# Patient Record
Sex: Female | Born: 2005 | Race: White | Hispanic: No | Marital: Single | Smoking: Never smoker
Health system: Southern US, Community
[De-identification: ages and names within clinical notes are randomized; demographics above are authoritative.]

## PROBLEM LIST (undated history)

## (undated) DIAGNOSIS — Z789 Other specified health status: Secondary | ICD-10-CM

## (undated) HISTORY — PX: WISDOM TOOTH EXTRACTION: SHX21

---
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
## 2023-01-06 ENCOUNTER — Encounter: Payer: Self-pay | Admitting: *Deleted

## 2023-01-25 ENCOUNTER — Telehealth: Payer: Self-pay

## 2023-01-25 NOTE — Telephone Encounter (Signed)
No working #. AS, CMA 

## 2023-02-08 DIAGNOSIS — L700 Acne vulgaris: Secondary | ICD-10-CM | POA: Diagnosis not present

## 2023-03-07 NOTE — Progress Notes (Unsigned)
    Nina Morales D.[facility] [address] [place] [address] Phone: [phone]   Assessment and Plan:     There are no diagnoses linked to this encounter.  ***   Pertinent previous records reviewed include ***   Follow Up: ***     Subjective:   I, Nina Morales, am serving as a Neurosurgeon for Doctor Richardean Sale  Chief Complaint: right foot pain   HPI:   03/08/2023 Patient is a 17 year old female complaining of right foot pain. Patient states  Relevant Historical Information: ***  Additional pertinent review of systems negative.   Current Outpatient Medications:    moxifloxacin (VIGAMOX) 0.5 % ophthalmic solution, Place 1 drop into both eyes 3 (three) times daily., Disp: 3 mL, Rfl: 0   Objective:     There were no vitals filed for this visit.    There is no height or weight on file to calculate BMI.    Physical Exam:    ***   Electronically signed by:  Nina Morales D.[facility] 10:26 AM 03/07/23

## 2023-03-08 ENCOUNTER — Ambulatory Visit (INDEPENDENT_AMBULATORY_CARE_PROVIDER_SITE_OTHER): Payer: Medicaid Other

## 2023-03-08 ENCOUNTER — Ambulatory Visit: Payer: Medicaid Other | Admitting: Sports Medicine

## 2023-03-08 VITALS — BP 120/84 | HR 58 | Ht 67.0 in | Wt 156.0 lb

## 2023-03-08 DIAGNOSIS — M79671 Pain in right foot: Secondary | ICD-10-CM | POA: Diagnosis not present

## 2023-03-08 MED ORDER — MELOXICAM 15 MG PO TABS: 15.0000 mg | ORAL_TABLET | Freq: Every day | ORAL | 0 refills | Status: DC

## 2023-03-08 NOTE — Patient Instructions (Signed)
-   Start meloxicam 15 mg daily x2 weeks.  If still having pain after 2 weeks, complete 3rd-week of meloxicam. May use remaining meloxicam as needed once daily for pain control.  Do not to use additional NSAIDs while taking meloxicam.  May use Tylenol [phone] mg 2 to 3 times a day for breakthrough pain. Recommend resting from running for 1 week then gradual re introduction Recommend getting a new pair of inserts can try fleet feet 3-4 week follow up

## 2023-03-14 NOTE — Progress Notes (Signed)
    Nina Morales D.[facility] [address] [place] [address] Phone: [phone]   Assessment and Plan:     1. Right foot pain -Acute, unchanged, subsequent visit - Continued right foot pain primarily along distal shaft and head of fifth metatarsal on plantar surface.  Still suspect bony contusion versus stress reaction from patient running - Patient continues to have pain despite no running for 1 week, and starting meloxicam, however patient did go to the beach for a week and walking along sand may have caused stress to this area - Continue and complete remaining course of meloxicam - Recommend no running for 2 weeks.  May swim to maintain cardiovascular endurance - Reassuring that patient had unremarkable x-ray at previous office visit, and unremarkable ultrasound at today's visit  Sports Medicine: Musculoskeletal Ultrasound. Exam: Limited US of fifth metatarsal, right Diagnosis: Right foot pain  US Findings: Unremarkable appearance of right fifth metatarsal.  Dorsum, lateral, plantar surface of fifth metatarsal evaluated without disturbance of cortex, without cortical thickening.  Mild hypoechoic changes along plantar surface of fifth metatarsal head which may indicate a contusion versus stress reaction  US Impression:  Fifth metatarsal stress reaction versus contusion    Pertinent previous records reviewed include none   Follow Up: 2 weeks for reevaluation.  If patient is not improving at that time, would further evaluate with MRI.   Subjective:   I, Nina Morales, am serving as a Neurosurgeon for Doctor Richardean Sale   Chief Complaint: right foot pain    HPI:    03/08/2023 Patient is a 17 year old female complaining of right foot pain. Patient states 2 weeks ago she had lateral foot pain on the bottom , no radiating pain, she had to change her running shoes, new running shoes had pain , she had pain when walking, standing, and at rest  , was running 25-30 a week. No meds for the pain. Has been icing but it helps with the pain and swelling   03/21/2023 Patient states that she is the same, meloxicam helps a little    Relevant Historical Information: None pertinent  Additional pertinent review of systems negative.   Current Outpatient Medications:    meloxicam (MOBIC) 15 MG tablet, Take 1 tablet (15 mg total) by mouth daily., Disp: 30 tablet, Rfl: 0   moxifloxacin (VIGAMOX) 0.5 % ophthalmic solution, Place 1 drop into both eyes 3 (three) times daily., Disp: 3 mL, Rfl: 0   Objective:     Vitals:   03/21/23 1355  BP: 118/78  Pulse: 63  SpO2: (!) 10%  Weight: 156 lb (70.8 kg)  Height: 5\' 7"  (1.702 m)      Body mass index is 24.43 kg/m.    Physical Exam:    Gen: Appears well, nad, nontoxic and pleasant Psych: Alert and oriented, appropriate mood and affect Neuro: sensation intact, strength is 5/5 with df/pf/inv/ev, muscle tone wnl Skin: no susupicious lesions or rashes   Right foot/ankle:  No deformity, no swelling or effusion Pes cavus bilaterally TTP distal shaft and head of fifth metatarsal NTTP over fibular head, lat mal, medial mal, achilles, navicular, base of 5th, ATFL, CFL, deltoid, calcaneous or midfoot ROM DF 30, PF 45, inv/ev intact Negative ant drawer, talar tilt, rotation test, squeeze test. Neg thompson No pain with resisted inversion or eversion     Electronically signed by:  Nina Morales [facility] 2:58 PM 03/21/23

## 2023-03-21 ENCOUNTER — Ambulatory Visit: Payer: Self-pay

## 2023-03-21 ENCOUNTER — Ambulatory Visit: Payer: Medicaid Other | Admitting: Sports Medicine

## 2023-03-21 VITALS — BP 118/78 | HR 63 | Ht 67.0 in | Wt 156.0 lb

## 2023-03-21 DIAGNOSIS — M79671 Pain in right foot: Secondary | ICD-10-CM

## 2023-03-21 NOTE — Patient Instructions (Signed)
Continue and complete meloxicam No running for 2 weeks 2 week follow up  Wear tennis shoes or comfortable footwear at all times

## 2023-03-22 DIAGNOSIS — Z113 Encounter for screening for infections with a predominantly sexual mode of transmission: Secondary | ICD-10-CM | POA: Diagnosis not present

## 2023-03-31 ENCOUNTER — Other Ambulatory Visit: Payer: Self-pay | Admitting: Sports Medicine

## 2023-04-01 NOTE — Progress Notes (Signed)
Aleen Sells D.[facility] [address] [place] [address] Phone: [phone]   Assessment and Plan:     1. Right foot pain - Acute, improved, subsequent visit - Significant improvement in right foot pain with completion of meloxicam course, rest and avoidance of running for the past 1 month - I suspect patient had bony contusion versus stress reaction from running activities that currently has healed - May gradually increase in return to running activities.  Recommend starting at 50% speed and distance, progression to 75%, and progressing to 100% with at least 24 hours in between progression to assess for pain or soreness - Discontinue meloxicam and may use remainder as needed -We discussed the possibility of OTC versus custom inserts.  As patient does not have a significant injury history, I personally do not recommend inserts at this time as they could lead to altered gait mechanics and stress and potentially cause more problems.  If patient continues to have ongoing foot and ankle issues, we could further assess for OTC versus custom orthotics  Pertinent previous records reviewed include none   Follow Up: As needed if no improvement or worsening of symptoms.  If pain returns, could evaluate further with MRI and could use boot to decrease pain   Subjective:   I, Moenique Parris, am serving as a Neurosurgeon for Doctor Richardean Sale   Chief Complaint: right foot pain    HPI:    03/08/2023 Patient is a 17 year old female complaining of right foot pain. Patient states 2 weeks ago she had lateral foot pain on the bottom , no radiating pain, she had to change her running shoes, new running shoes had pain , she had pain when walking, standing, and at rest , was running 25-30 a week. No meds for the pain. Has been icing but it helps with the pain and swelling    03/21/2023 Patient states that she is the same, meloxicam helps a little     04/04/2023 Patient states pain has improved and she's ready to run well she thinks she is      Relevant Historical Information: None pertinent  Additional pertinent review of systems negative.   Current Outpatient Medications:    meloxicam (MOBIC) 15 MG tablet, Take 1 tablet (15 mg total) by mouth daily., Disp: 30 tablet, Rfl: 0   moxifloxacin (VIGAMOX) 0.5 % ophthalmic solution, Place 1 drop into both eyes 3 (three) times daily., Disp: 3 mL, Rfl: 0   Objective:     Vitals:   04/04/23 1337  BP: 118/82  Pulse: (!) 57  SpO2: 100%  Weight: 155 lb (70.3 kg)  Height: 5\' 7"  (1.702 m)      Body mass index is 24.28 kg/m.    Physical Exam:    Gen: Appears well, nad, nontoxic and pleasant Psych: Alert and oriented, appropriate mood and affect Neuro: sensation intact, strength is 5/5 with df/pf/inv/ev, muscle tone wnl Skin: no susupicious lesions or rashes   Right foot/ankle:  No deformity, no swelling or effusion Pes cavus bilaterally NTTP distal shaft and head of fifth metatarsal No pain with single foot heel raise NTTP over fibular head, lat mal, medial mal, achilles, navicular, base of 5th, ATFL, CFL, deltoid, calcaneous or midfoot ROM DF 30, PF 45, inv/ev intact Negative ant drawer, talar tilt, rotation test, squeeze test. Neg thompson No pain with resisted inversion or eversion     Electronically signed by:  Aleen Sells D.[facility] 1:59 PM 04/04/23

## 2023-04-04 ENCOUNTER — Ambulatory Visit: Payer: Medicaid Other | Admitting: Sports Medicine

## 2023-04-04 VITALS — BP 118/82 | HR 57 | Ht 67.0 in | Wt 155.0 lb

## 2023-04-04 DIAGNOSIS — M79671 Pain in right foot: Secondary | ICD-10-CM

## 2023-04-04 NOTE — Patient Instructions (Signed)
May gradually re introduce running recommend starting at 50% speed and distance and then can increase to 75% and 100% As needed follow up

## 2023-06-10 DIAGNOSIS — M7741 Metatarsalgia, right foot: Secondary | ICD-10-CM | POA: Diagnosis not present

## 2023-06-26 DIAGNOSIS — M79671 Pain in right foot: Secondary | ICD-10-CM | POA: Diagnosis not present

## 2023-07-05 DIAGNOSIS — M7741 Metatarsalgia, right foot: Secondary | ICD-10-CM | POA: Diagnosis not present

## 2023-10-10 DIAGNOSIS — S52551A Other extraarticular fracture of lower end of right radius, initial encounter for closed fracture: Secondary | ICD-10-CM | POA: Diagnosis not present

## 2023-10-10 DIAGNOSIS — S52614A Nondisplaced fracture of right ulna styloid process, initial encounter for closed fracture: Secondary | ICD-10-CM | POA: Diagnosis not present

## 2023-10-13 DIAGNOSIS — S52551A Other extraarticular fracture of lower end of right radius, initial encounter for closed fracture: Secondary | ICD-10-CM | POA: Diagnosis not present

## 2023-10-20 DIAGNOSIS — S52551A Other extraarticular fracture of lower end of right radius, initial encounter for closed fracture: Secondary | ICD-10-CM | POA: Diagnosis not present

## 2023-10-21 ENCOUNTER — Other Ambulatory Visit: Payer: Self-pay

## 2023-10-21 ENCOUNTER — Encounter (HOSPITAL_COMMUNITY): Payer: Self-pay | Admitting: Orthopedic Surgery

## 2023-10-21 NOTE — H&P (Signed)
Nina Morales is an 18 y.o. female.   Chief Complaint: Rightt wrist injury  HPI: Pt followed in office Pt fell on right wrist snowboarding Pt with displaced right distal radius fracture Pt here for surgery No prior surgery to right wrist  Past Medical History:  Diagnosis Date   Medical history non-contributory     Past Surgical History:  Procedure Laterality Date   WISDOM TOOTH EXTRACTION      History reviewed. No pertinent family history. Social History:  reports that she has never smoked. She does not have any smokeless tobacco history on file. She reports that she does not drink alcohol and does not use drugs.  Allergies: No Known Allergies  No medications prior to admission.    No results found for this or any previous visit (from the past 48 hours). No results found.  ROSNO RECENT ILLNESSES OR HOSPITALIZATIONS  Last menstrual period 10/14/2023. Physical Exam  General Appearance:  Alert, cooperative, no distress, appears stated age  Head:  Normocephalic, without obvious abnormality, atraumatic  Eyes:  Pupils equal, conjunctiva/corneas clear,         Throat: Lips, mucosa, and tongue normal; teeth and gums normal  Neck: No visible masses     Lungs:   respirations unlabored  Chest Wall:  No tenderness or deformity  Heart:  Regular rate and rhythm,  Abdomen:   Soft, non-tender,         Extremities: RUE: CAST IN GOOD CONDITION, FINGERS WARM WELL PERFUSED ABLE TO EXTEND THUMB AND FINGERS  Pulses: 2+ and symmetric  Skin: Skin color, texture, turgor normal, no rashes or lesions     Neurologic: Normal     Assessment/Plan RIGHT DISTAL RADIUS FRACTURE DISPLACED  RIGHT WRIST OPEN REDUCTION AND INTERNAL FIXATION AND REPAIR AS INDICATED  R/B/A DISCUSSED WITH PT IN OFFICE.  PT VOICED UNDERSTANDING OF PLAN CONSENT SIGNED DAY OF SURGERY PT SEEN AND EXAMINED PRIOR TO OPERATIVE PROCEDURE/DAY OF SURGERY SITE MARKED. QUESTIONS ANSWERED WILL GO HOME FOLLOWING SURGERY    WE ARE PLANNING SURGERY FOR YOUR UPPER EXTREMITY. THE RISKS AND BENEFITS OF SURGERY INCLUDE BUT NOT LIMITED TO BLEEDING INFECTION, DAMAGE TO NEARBY NERVES ARTERIES TENDONS, FAILURE OF SURGERY TO ACCOMPLISH ITS INTENDED GOALS, PERSISTENT SYMPTOMS AND NEED FOR FURTHER SURGICAL INTERVENTION. WITH THIS IN MIND WE WILL PROCEED. I HAVE DISCUSSED WITH THE PATIENT THE PRE AND POSTOPERATIVE REGIMEN AND THE DOS AND DON'TS. PT VOICED UNDERSTANDING AND INFORMED CONSENT SIGNED.     Sharma Covert 10/21/2023, 3:50 PM

## 2023-10-24 ENCOUNTER — Encounter (HOSPITAL_COMMUNITY)
Admission: RE | Disposition: A | Discharge status: Home / Self Care | Payer: Self-pay | Source: Home / Self Care | Attending: Orthopedic Surgery

## 2023-10-24 ENCOUNTER — Ambulatory Visit (HOSPITAL_BASED_OUTPATIENT_CLINIC_OR_DEPARTMENT_OTHER): Payer: Medicaid Other | Admitting: Anesthesiology

## 2023-10-24 ENCOUNTER — Other Ambulatory Visit: Payer: Self-pay

## 2023-10-24 ENCOUNTER — Ambulatory Visit (HOSPITAL_COMMUNITY): Payer: Medicaid Other

## 2023-10-24 ENCOUNTER — Ambulatory Visit (HOSPITAL_COMMUNITY)
Admission: RE | Admit: 2023-10-24 | Discharge: 2023-10-24 | Disposition: A | Discharge status: Home / Self Care | Payer: Medicaid Other | Attending: Orthopedic Surgery | Admitting: Orthopedic Surgery

## 2023-10-24 ENCOUNTER — Ambulatory Visit (HOSPITAL_COMMUNITY): Payer: Self-pay | Admitting: Anesthesiology

## 2023-10-24 ENCOUNTER — Encounter (HOSPITAL_COMMUNITY): Payer: Self-pay | Admitting: Orthopedic Surgery

## 2023-10-24 DIAGNOSIS — W19XXXA Unspecified fall, initial encounter: Secondary | ICD-10-CM | POA: Diagnosis not present

## 2023-10-24 DIAGNOSIS — S52501A Unspecified fracture of the lower end of right radius, initial encounter for closed fracture: Secondary | ICD-10-CM

## 2023-10-24 DIAGNOSIS — Y9323 Activity, snow (alpine) (downhill) skiing, snow boarding, sledding, tobogganing and snow tubing: Secondary | ICD-10-CM | POA: Insufficient documentation

## 2023-10-24 DIAGNOSIS — S52551A Other extraarticular fracture of lower end of right radius, initial encounter for closed fracture: Secondary | ICD-10-CM | POA: Insufficient documentation

## 2023-10-24 HISTORY — PX: OPEN REDUCTION INTERNAL FIXATION (ORIF) DISTAL RADIAL FRACTURE: SHX5989

## 2023-10-24 HISTORY — DX: Other specified health status: Z78.9

## 2023-10-24 LAB — POCT PREGNANCY, URINE: Preg Test, Ur: NEGATIVE

## 2023-10-24 SURGERY — OPEN REDUCTION INTERNAL FIXATION (ORIF) DISTAL RADIUS FRACTURE
Anesthesia: Regional | Site: Arm Lower | Laterality: Right

## 2023-10-24 MED ORDER — ACETAMINOPHEN 160 MG/5ML PO SOLN: 325.0000 mg | ORAL | Status: DC | PRN

## 2023-10-24 MED ORDER — ONDANSETRON HCL 4 MG/2ML IJ SOLN
INTRAMUSCULAR | Status: DC | PRN
  Administered 2023-10-24: 4 mg via INTRAVENOUS

## 2023-10-24 MED ORDER — EPHEDRINE SULFATE-NACL 50-0.9 MG/10ML-% IV SOSY
PREFILLED_SYRINGE | INTRAVENOUS | Status: DC | PRN
  Administered 2023-10-24: 5 mg via INTRAVENOUS
  Administered 2023-10-24: 7.5 mg via INTRAVENOUS
  Administered 2023-10-24: 5 mg via INTRAVENOUS

## 2023-10-24 MED ORDER — CEFAZOLIN SODIUM-DEXTROSE 2-4 GM/100ML-% IV SOLN
2.0000 g | INTRAVENOUS | Status: AC
  Administered 2023-10-24: 2 g via INTRAVENOUS
  Filled 2023-10-24: qty 100

## 2023-10-24 MED ORDER — ONDANSETRON HCL 4 MG/2ML IJ SOLN: 4.0000 mg | Freq: Once | INTRAMUSCULAR | Status: DC | PRN

## 2023-10-24 MED ORDER — MEPERIDINE HCL 25 MG/ML IJ SOLN: 6.2500 mg | INTRAMUSCULAR | Status: DC | PRN

## 2023-10-24 MED ORDER — DEXAMETHASONE SODIUM PHOSPHATE 10 MG/ML IJ SOLN
INTRAMUSCULAR | Status: DC | PRN
  Administered 2023-10-24: 5 mg via INTRAVENOUS

## 2023-10-24 MED ORDER — FENTANYL CITRATE (PF) 100 MCG/2ML IJ SOLN: 100.0000 ug | Freq: Once | INTRAMUSCULAR | Status: AC

## 2023-10-24 MED ORDER — PROPOFOL 10 MG/ML IV BOLUS
INTRAVENOUS | Status: AC
  Filled 2023-10-24: qty 20

## 2023-10-24 MED ORDER — EPHEDRINE 5 MG/ML INJ
INTRAVENOUS | Status: AC
  Filled 2023-10-24: qty 5

## 2023-10-24 MED ORDER — ACETAMINOPHEN 325 MG PO TABS: 325.0000 mg | ORAL_TABLET | ORAL | Status: DC | PRN

## 2023-10-24 MED ORDER — LACTATED RINGERS IV SOLN: INTRAVENOUS | Status: DC

## 2023-10-24 MED ORDER — MIDAZOLAM HCL 2 MG/2ML IJ SOLN
INTRAMUSCULAR | Status: AC
  Administered 2023-10-24: 2 mg via INTRAVENOUS
  Filled 2023-10-24: qty 2

## 2023-10-24 MED ORDER — ORAL CARE MOUTH RINSE: 15.0000 mL | Freq: Once | OROMUCOSAL | Status: AC

## 2023-10-24 MED ORDER — BUPIVACAINE HCL (PF) 0.5 % IJ SOLN
INTRAMUSCULAR | Status: DC | PRN
  Administered 2023-10-24: 10 mL

## 2023-10-24 MED ORDER — OXYCODONE HCL 5 MG/5ML PO SOLN: 5.0000 mg | Freq: Once | ORAL | Status: DC | PRN

## 2023-10-24 MED ORDER — FENTANYL CITRATE (PF) 100 MCG/2ML IJ SOLN
INTRAMUSCULAR | Status: AC
  Administered 2023-10-24: 100 ug via INTRAVENOUS
  Filled 2023-10-24: qty 2

## 2023-10-24 MED ORDER — ONDANSETRON HCL 4 MG/2ML IJ SOLN
INTRAMUSCULAR | Status: AC
  Filled 2023-10-24: qty 2

## 2023-10-24 MED ORDER — MIDAZOLAM HCL 2 MG/2ML IJ SOLN: 2.0000 mg | Freq: Once | INTRAMUSCULAR | Status: AC

## 2023-10-24 MED ORDER — PROPOFOL 500 MG/50ML IV EMUL
INTRAVENOUS | Status: DC | PRN
  Administered 2023-10-24: 125 ug/kg/min via INTRAVENOUS
  Administered 2023-10-24 (×2): 25 mg via INTRAVENOUS

## 2023-10-24 MED ORDER — OXYCODONE HCL 5 MG PO TABS: 5.0000 mg | ORAL_TABLET | Freq: Once | ORAL | Status: DC | PRN

## 2023-10-24 MED ORDER — FENTANYL CITRATE (PF) 100 MCG/2ML IJ SOLN: 25.0000 ug | INTRAMUSCULAR | Status: DC | PRN

## 2023-10-24 MED ORDER — BUPIVACAINE HCL (PF) 0.25 % IJ SOLN
INTRAMUSCULAR | Status: AC
  Filled 2023-10-24: qty 30

## 2023-10-24 MED ORDER — CHLORHEXIDINE GLUCONATE 0.12 % MT SOLN
15.0000 mL | Freq: Once | OROMUCOSAL | Status: AC
  Administered 2023-10-24: 15 mL via OROMUCOSAL
  Filled 2023-10-24: qty 15

## 2023-10-24 MED ORDER — DEXAMETHASONE SODIUM PHOSPHATE 10 MG/ML IJ SOLN
INTRAMUSCULAR | Status: AC
  Filled 2023-10-24: qty 1

## 2023-10-24 MED ORDER — 0.9 % SODIUM CHLORIDE (POUR BTL) OPTIME
TOPICAL | Status: DC | PRN
  Administered 2023-10-24: 1000 mL

## 2023-10-24 MED ORDER — BUPIVACAINE LIPOSOME 1.3 % IJ SUSP
INTRAMUSCULAR | Status: DC | PRN
  Administered 2023-10-24: 10 mL via PERINEURAL

## 2023-10-24 SURGICAL SUPPLY — 52 items
BAG COUNTER SPONGE SURGICOUNT (BAG) ×1 IMPLANT
BIT DRILL 2.2 SS TIBIAL (BIT) IMPLANT
BNDG ELASTIC 2 VLCR STRL LF (GAUZE/BANDAGES/DRESSINGS) IMPLANT
BNDG ELASTIC 3INX 5YD STR LF (GAUZE/BANDAGES/DRESSINGS) ×1 IMPLANT
BNDG ELASTIC 4X5.8 VLCR STR LF (GAUZE/BANDAGES/DRESSINGS) ×1 IMPLANT
BNDG ESMARK 4X9 LF (GAUZE/BANDAGES/DRESSINGS) ×1 IMPLANT
BNDG GAUZE DERMACEA FLUFF 4 (GAUZE/BANDAGES/DRESSINGS) ×1 IMPLANT
CANISTER SUCT 3000ML PPV (MISCELLANEOUS) ×1 IMPLANT
CORD BIPOLAR FORCEPS 12FT (ELECTRODE) ×1 IMPLANT
COVER SURGICAL LIGHT HANDLE (MISCELLANEOUS) ×1 IMPLANT
CUFF TOURN SGL QUICK 18X4 (TOURNIQUET CUFF) ×1 IMPLANT
CUFF TRNQT CYL 24X4X16.5-23 (TOURNIQUET CUFF) IMPLANT
DRAPE OEC MINIVIEW 54X84 (DRAPES) ×1 IMPLANT
DRAPE SURG 17X11 SM STRL (DRAPES) ×1 IMPLANT
DRSG ADAPTIC 3X8 NADH LF (GAUZE/BANDAGES/DRESSINGS) ×1 IMPLANT
GAUZE 4X4 16PLY ~~LOC~~+RFID DBL (SPONGE) ×1 IMPLANT
GAUZE SPONGE 4X4 12PLY STRL (GAUZE/BANDAGES/DRESSINGS) ×1 IMPLANT
GAUZE XEROFORM 5X9 LF (GAUZE/BANDAGES/DRESSINGS) ×1 IMPLANT
GLOVE BIOGEL PI IND STRL 8.5 (GLOVE) ×1 IMPLANT
GLOVE SURG ORTHO 8.0 STRL STRW (GLOVE) ×1 IMPLANT
GOWN STRL REUS W/ TWL LRG LVL3 (GOWN DISPOSABLE) ×1 IMPLANT
GOWN STRL REUS W/ TWL XL LVL3 (GOWN DISPOSABLE) ×1 IMPLANT
K-WIRE FX5X1.6XNS BN SS (WIRE) ×1
KIT BASIN OR (CUSTOM PROCEDURE TRAY) ×1 IMPLANT
KIT TURNOVER KIT B (KITS) ×1 IMPLANT
KWIRE FX5X1.6XNS BN SS (WIRE) IMPLANT
NDL HYPO 25X1 1.5 SAFETY (NEEDLE) ×1 IMPLANT
NEEDLE HYPO 25X1 1.5 SAFETY (NEEDLE) ×1 IMPLANT
NS IRRIG 1000ML POUR BTL (IV SOLUTION) ×1 IMPLANT
PACK ORTHO EXTREMITY (CUSTOM PROCEDURE TRAY) ×1 IMPLANT
PAD ARMBOARD 7.5X6 YLW CONV (MISCELLANEOUS) ×2 IMPLANT
PAD CAST 4YDX4 CTTN HI CHSV (CAST SUPPLIES) ×1 IMPLANT
PADDING CAST ABS COTTON 4X4 ST (CAST SUPPLIES) IMPLANT
PEG LOCKING SMOOTH 2.2X16 (Screw) IMPLANT
PEG LOCKING SMOOTH 2.2X18 (Peg) IMPLANT
PEG LOCKING SMOOTH 2.2X20 (Screw) IMPLANT
PLATE DVR CROSSLOCK STD RT (Plate) IMPLANT
SCREW LOCK 14X2.7X 3 LD TPR (Screw) IMPLANT
SCREW LOCK 16X2.7X 3 LD TPR (Screw) IMPLANT
SOAP 2 % CHG 4 OZ (WOUND CARE) ×1 IMPLANT
SPIKE FLUID TRANSFER (MISCELLANEOUS) ×1 IMPLANT
SPLINT FIBERGLASS 3X12 (CAST SUPPLIES) IMPLANT
SPONGE T-LAP 4X18 ~~LOC~~+RFID (SPONGE) ×1 IMPLANT
STRIP CLOSURE SKIN 1/2X4 (GAUZE/BANDAGES/DRESSINGS) IMPLANT
SUT MNCRL AB 4-0 PS2 18 (SUTURE) IMPLANT
SUT PROLENE 4 0 PS 2 18 (SUTURE) IMPLANT
SUT VIC AB 2-0 CT2 27 (SUTURE) IMPLANT
TOWEL GREEN STERILE (TOWEL DISPOSABLE) ×1 IMPLANT
TOWEL GREEN STERILE FF (TOWEL DISPOSABLE) IMPLANT
TUBE CONNECTING 12X1/4 (SUCTIONS) ×1 IMPLANT
WATER STERILE IRR 1000ML POUR (IV SOLUTION) ×1 IMPLANT
YANKAUER SUCT BULB TIP NO VENT (SUCTIONS) IMPLANT

## 2023-10-24 NOTE — Op Note (Signed)
PREOPERATIVE DIAGNOSIS: Right wrist extra-articular distal radius fracture  POSTOPERATIVE DIAGNOSIS: Same  ATTENDING SURGEON: Dr. Bradly Bienenstock who scrubbed and present for the entire procedure  ASSISTANT SURGEON: None  ANESTHESIA: Regional with IV sedation  OPERATIVE PROCEDURE: Open treatment of right wrist extra-articular distal radius fracture with internal fixation Right wrist brachial radialis tendon release, tendon tenotomy Radiographs 3 views right wrist  IMPLANTS: [employer] DVR cross lock standard  EBL: Minimal  RADIOGRAPHIC INTERPRETATION: AP lateral oblique views of the wrist do show the volar plate fixation in place with good alignment in all planes.  SURGICAL INDICATIONS: Patient is a right-hand-dominant female who injured her left wrist snowboarding.  Patient did sustained a displaced distal radius fracture.  Patient was seen evaluate in the office and recommend undergo the above procedure.  The risks of surgery include but not limited to bleeding infection damage nearby nerves arteries or tendons loss of motion of the wrist and digits incomplete relief of symptoms and need for further surgical intervention.  A signed informed consent was obtained on the day of surgery.  SURGICAL TECHNIQUE: The patient was prepped identified in the preoperative holding area marked apart a marker made on the right wrist indicate correct operative site.  The patient brought back the operating placed supine on the anesthesia table where the regional anesthetic was administered.  Preoperative antibiotics were given prior to any skin incision.  Well-padded tourniquet placed on the right brachium stay with the appropriate drape.  The right upper extremities then prepped and draped normal sterile fashion.  A timeout was called the correct site was identified procedure then began.  Attention was then turned to the right wrist and a longitude incision made directly over the FCR sheath.  Dissection carried down  through the skin and subcutaneous tissue.  The FCR sheath was then mobilized.  Going through the floor the FCR the pronator quadratus was then identified.  An L-shaped pronator quadratus flap was then elevated.  The fracture site was then identified.  In order to reduce and gain exposure of the radial column the brachial radialis was then carefully released and tendon tenotomy was then done.  This was done under direct visualization with careful protection the first dorsal compartment tendons.  Following this the volar plate was then applied.  Open reduction was then applied and internal fixation was then begun.  The plate position was then confirmed using the mini C arm after the K wire was then placed distally.  The oblong hole was then placed proximally.  The plate height was then adjusted.  Distal fixation was then carried out from an ulnar to radial direction with distal locking pegs.  Following this shaft screw fixation was then completed with a combination of locking nonlocking screws.  The wound was then thoroughly irrigated.  Final radiographs were then obtained.  The pronator quadratus was then closed with 2-0 Vicryl.  Subcutaneous tissues closed with Monocryl.  Skin closed with a running 4-0 subcuticular Prolene.  Steri-Strips were applied.  Sterile compressive bandage then applied.  The patient placed in well-padded volar splint taken recovery in good condition.  POSTOPERATIVE PLAN: Patient be discharged to home.  See her back in the office in approxi-11 days for wound check suture removal x-rays application of a short arm cast for 2 weeks then begin a therapy regimen at the next visit.  Radiographs at each visit.

## 2023-10-24 NOTE — Anesthesia Postprocedure Evaluation (Signed)
Anesthesia Post Note  Patient: Nina Morales  Procedure(s) Performed: OPEN REDUCTION INTERNAL FIXATION (ORIF) DISTAL RADIUS FRACTURE (Right: Arm Lower)     Patient location during evaluation: PACU Anesthesia Type: Regional Level of consciousness: awake and alert Pain management: pain level controlled Vital Signs Assessment: post-procedure vital signs reviewed and stable Respiratory status: spontaneous breathing, nonlabored ventilation and respiratory function stable Cardiovascular status: stable and blood pressure returned to baseline Postop Assessment: no apparent nausea or vomiting Anesthetic complications: no   No notable events documented.  Last Vitals:  Vitals:   10/24/23 1730 10/24/23 1742  BP: (!) 97/50 97/61  Pulse: (!) 45 (!) 49  Resp: 16 18  Temp:  36.6 C  SpO2: 99% 100%    Last Pain:  Vitals:   10/24/23 1730  PainSc: 0-No pain                 Collene Schlichter

## 2023-10-24 NOTE — Anesthesia Preprocedure Evaluation (Addendum)
Anesthesia Evaluation  Patient identified by MRN, date of birth, ID band Patient awake    Reviewed: Allergy & Precautions, H&P , NPO status , Patient's Chart, lab work & pertinent test results  Airway Mallampati: II  TM Distance: >3 FB Neck ROM: Full    Dental no notable dental hx.    Pulmonary neg pulmonary ROS   Pulmonary exam normal breath sounds clear to auscultation       Cardiovascular Exercise Tolerance: Good negative cardio ROS Normal cardiovascular exam Rhythm:Regular Rate:Normal     Neuro/Psych negative neurological ROS  negative psych ROS   GI/Hepatic negative GI ROS, Neg liver ROS,,,  Endo/Other  negative endocrine ROS    Renal/GU negative Renal ROS  negative genitourinary   Musculoskeletal negative musculoskeletal ROS (+)    Abdominal   Peds negative pediatric ROS (+)  Hematology negative hematology ROS (+)   Anesthesia Other Findings   Reproductive/Obstetrics negative OB ROS                             Anesthesia Physical Anesthesia Plan  ASA: 2  Anesthesia Plan: Regional and MAC   Post-op Pain Management: Regional block*, Tylenol PO (pre-op)* and Celebrex PO (pre-op)*   Induction: Intravenous  PONV Risk Score and Plan: 3 and Ondansetron, Dexamethasone and Treatment may vary due to age or medical condition  Airway Management Planned: Natural Airway, Simple Face Mask and Mask  Additional Equipment: None  Intra-op Plan:   Post-operative Plan: Extubation in OR  Informed Consent: I have reviewed the patients History and Physical, chart, labs and discussed the procedure including the risks, benefits and alternatives for the proposed anesthesia with the patient or authorized representative who has indicated his/her understanding and acceptance.     Dental advisory given  Plan Discussed with: Anesthesiologist and CRNA  Anesthesia Plan Comments: (Discussed both  nerve block for pain relief post-op and GA; including NV, sore throat, dental injury, and pulmonary complications)        Anesthesia Quick Evaluation

## 2023-10-24 NOTE — Anesthesia Procedure Notes (Signed)
Anesthesia Regional Block: Supraclavicular block   Pre-Anesthetic Checklist: , timeout performed,  Correct Patient, Correct Site, Correct Laterality,  Correct Procedure, Correct Position, site marked,  Risks and benefits discussed,  Surgical consent,  Pre-op evaluation,  At surgeon's request and post-op pain management  Laterality: Right  Prep: chloraprep       Needles:  Injection technique: Single-shot  Needle Type: Echogenic Stimulator Needle     Needle Length: 5cm  Needle Gauge: 22     Additional Needles:   Procedures:, nerve stimulator,,, ultrasound used (permanent image in chart),,     Nerve Stimulator or Paresthesia:  Response: hand, 0.45 mA  Additional Responses:   Narrative:  Start time: 10/24/2023 2:20 PM End time: 10/24/2023 2:26 PM Injection made incrementally with aspirations every 5 mL.  Performed by: Personally  Anesthesiologist: Bethena Midget, MD  Additional Notes: Functioning IV was confirmed and monitors were applied.  A 50mm 22ga Arrow echogenic stimulator needle was used. Sterile prep and drape,hand hygiene and sterile gloves were used. Ultrasound guidance: relevant anatomy identified, needle position confirmed, local anesthetic spread visualized around nerve(s)., vascular puncture avoided.  Image printed for medical record. Negative aspiration and negative test dose prior to incremental administration of local anesthetic. The patient tolerated the procedure well.

## 2023-10-24 NOTE — Progress Notes (Signed)
Dr. Melvyn Novas here and stating to remove cast at this time. Ortho tech called and on the way.

## 2023-10-24 NOTE — Transfer of Care (Signed)
Immediate Anesthesia Transfer of Care Note  Patient: Nina Morales  Procedure(s) Performed: OPEN REDUCTION INTERNAL FIXATION (ORIF) DISTAL RADIUS FRACTURE (Right: Arm Lower)  Patient Location: PACU  Anesthesia Type:MAC and Regional  Level of Consciousness: awake, alert , and oriented  Airway & Oxygen Therapy: Patient Spontanous Breathing  Post-op Assessment: Report given to RN, Post -op Vital signs reviewed and stable, and Patient moving all extremities  Post vital signs: Reviewed and stable  Last Vitals:  Vitals Value Taken Time  BP 103/55 10/24/23 1711  Temp    Pulse 48 10/24/23 1713  Resp 17 10/24/23 1713  SpO2 99 % 10/24/23 1713  Vitals shown include unfiled device data.  Last Pain:  Vitals:   10/24/23 1420  PainSc: 0-No pain         Complications: No notable events documented.

## 2023-10-24 NOTE — Discharge Instructions (Signed)
KEEP BANDAGE [facility] AND DRY CALL OFFICE FOR F/U APPT [phone] in 11 days Rx sent to [facility] [address] KEEP HAND ELEVATED ABOVE HEART OK TO APPLY ICE TO OPERATIVE AREA CONTACT OFFICE IF ANY WORSENING PAIN OR CONCERNS.

## 2023-10-25 ENCOUNTER — Encounter (HOSPITAL_COMMUNITY): Payer: Self-pay | Admitting: Orthopedic Surgery

## 2023-11-03 DIAGNOSIS — S52551D Other extraarticular fracture of lower end of right radius, subsequent encounter for closed fracture with routine healing: Secondary | ICD-10-CM | POA: Diagnosis not present

## 2023-11-07 DIAGNOSIS — L702 Acne varioliformis: Secondary | ICD-10-CM | POA: Diagnosis not present

## 2023-11-07 DIAGNOSIS — L810 Postinflammatory hyperpigmentation: Secondary | ICD-10-CM | POA: Diagnosis not present

## 2023-11-07 DIAGNOSIS — Z5181 Encounter for therapeutic drug level monitoring: Secondary | ICD-10-CM | POA: Diagnosis not present

## 2023-11-21 DIAGNOSIS — Z0000 Encounter for general adult medical examination without abnormal findings: Secondary | ICD-10-CM | POA: Diagnosis not present

## 2023-11-21 DIAGNOSIS — Z23 Encounter for immunization: Secondary | ICD-10-CM | POA: Diagnosis not present

## 2023-11-25 DIAGNOSIS — S52551D Other extraarticular fracture of lower end of right radius, subsequent encounter for closed fracture with routine healing: Secondary | ICD-10-CM | POA: Diagnosis not present

## 2023-11-29 DIAGNOSIS — M25531 Pain in right wrist: Secondary | ICD-10-CM | POA: Diagnosis not present

## 2023-12-06 DIAGNOSIS — M25531 Pain in right wrist: Secondary | ICD-10-CM | POA: Diagnosis not present

## 2023-12-06 DIAGNOSIS — M25539 Pain in unspecified wrist: Secondary | ICD-10-CM | POA: Diagnosis not present

## 2023-12-09 DIAGNOSIS — L702 Acne varioliformis: Secondary | ICD-10-CM | POA: Diagnosis not present

## 2023-12-13 DIAGNOSIS — M25539 Pain in unspecified wrist: Secondary | ICD-10-CM | POA: Diagnosis not present

## 2023-12-13 DIAGNOSIS — M25531 Pain in right wrist: Secondary | ICD-10-CM | POA: Diagnosis not present

## 2023-12-20 DIAGNOSIS — M25539 Pain in unspecified wrist: Secondary | ICD-10-CM | POA: Diagnosis not present

## 2023-12-20 DIAGNOSIS — M25531 Pain in right wrist: Secondary | ICD-10-CM | POA: Diagnosis not present

## 2023-12-27 DIAGNOSIS — S52551D Other extraarticular fracture of lower end of right radius, subsequent encounter for closed fracture with routine healing: Secondary | ICD-10-CM | POA: Diagnosis not present

## 2023-12-27 DIAGNOSIS — M25531 Pain in right wrist: Secondary | ICD-10-CM | POA: Diagnosis not present

## 2023-12-27 DIAGNOSIS — M25539 Pain in unspecified wrist: Secondary | ICD-10-CM | POA: Diagnosis not present

## 2023-12-28 ENCOUNTER — Other Ambulatory Visit: Payer: Self-pay

## 2023-12-28 ENCOUNTER — Encounter (HOSPITAL_BASED_OUTPATIENT_CLINIC_OR_DEPARTMENT_OTHER): Payer: Self-pay | Admitting: Urology

## 2023-12-28 ENCOUNTER — Emergency Department (HOSPITAL_BASED_OUTPATIENT_CLINIC_OR_DEPARTMENT_OTHER)
Admission: EM | Admit: 2023-12-28 | Discharge: 2023-12-28 | Disposition: A | Discharge status: Home / Self Care | Attending: Emergency Medicine | Admitting: Emergency Medicine

## 2023-12-28 DIAGNOSIS — H00014 Hordeolum externum left upper eyelid: Secondary | ICD-10-CM | POA: Diagnosis not present

## 2023-12-28 DIAGNOSIS — H00021 Hordeolum internum right upper eyelid: Secondary | ICD-10-CM | POA: Diagnosis not present

## 2023-12-28 DIAGNOSIS — H00024 Hordeolum internum left upper eyelid: Secondary | ICD-10-CM | POA: Diagnosis not present

## 2023-12-28 DIAGNOSIS — H0289 Other specified disorders of eyelid: Secondary | ICD-10-CM | POA: Diagnosis present

## 2023-12-28 MED ORDER — TOBRAMYCIN 0.3 % OP OINT
TOPICAL_OINTMENT | Freq: Three times a day (TID) | OPHTHALMIC | Status: DC
Start: 2023-12-28 — End: 2023-12-28
  Administered 2023-12-28: 1 via OPHTHALMIC
  Filled 2023-12-28: qty 3.5

## 2023-12-28 NOTE — Discharge Instructions (Signed)
 Please read and follow all provided instructions.  Your diagnoses today include:  1. Hordeolum internum of left upper eyelid     Tests performed today include: Vital signs. See below for your results today.   Medications prescribed:  Tobrex (tobramycin) - antibiotic eye ointment  Use this medication as follows: Apply 1/2" of the antibiotic ointment to affected eye 3 times per day while awake  Take any prescribed medications only as directed.  Home care instructions:  Follow any educational materials contained in this packet. If you wear contact lenses, do not use them until your eye caregiver approves. Follow-up care is necessary to be sure the infection is healing if not completely resolved in 2-3 days. See your caregiver or eye specialist as suggested for followup.   If you have an eye infection, wash your hands often as this is very contagious and is easily spread from person to person.   Follow-up instructions: Please follow-up with your primary care doctor OR the opthalmologist listed in the next 2-3 days for further evaluation of your symptoms.  Return instructions:  Please return to the Emergency Department if you experience worsening symptoms.  Please return immediately if you develop severe pain, pus drainage, new change in vision, or fever. Please return if you have any other emergent concerns.  Additional Information:  Your vital signs today were: BP 122/85   Pulse 70   Temp 97.8 F (36.6 C)   Resp 18   Ht 5\' 8"  (1.727 m)   Wt 70.3 kg   LMP 12/13/2023   SpO2 96%   BMI 23.57 kg/m  If your blood pressure (BP) was elevated above 135/85 this visit, please have this repeated by your doctor within one month. ---------------

## 2023-12-28 NOTE — ED Provider Notes (Signed)
 [place] EMERGENCY DEPARTMENT AT [facility] [place] Provider Note   CSN: [id number] Arrival date & time: 12/28/23  1542     History  No chief complaint on file.   Nina Morales is a 18 y.o. female.  Patient, who wears contact lenses, presents to the emergency department for evaluation of left upper eyelid swelling for the past 2 days.  She has noted a small localized area of swelling that she states is a stye.  She also reports some drainage from the right side.  She was putting on the eyelid area and had a little bit of pus drained out of the eyelid "waterline".  She denies significant pain or fevers.  No swelling around the eye, just localized to the eyelid.  No injuries or foreign bodies.  She reports starting Accutane 2 weeks ago.  No visual acuity changes.  She has applied a warm compress "once or twice".       Home Medications Prior to Admission medications   Not on File      Allergies    Patient has no known allergies.    Review of Systems   Review of Systems  Physical Exam Updated Vital Signs BP 122/85   Pulse 70   Temp 97.8 F (36.6 C)   Resp 18   Ht 5\' 8"  (1.727 m)   Wt 70.3 kg   LMP 12/13/2023   SpO2 96%   BMI 23.57 kg/m  Physical Exam Vitals and nursing note reviewed.  Constitutional:      Appearance: She is well-developed.  HENT:     Head: Normocephalic and atraumatic.  Eyes:     Conjunctiva/sclera: Conjunctivae normal.     Comments: Small internal hordeolum noted medial left upper eyelid without signs of periorbital cellulitis.  Patient with some mild eyelid edema on the right side without hordeolum or other eyelid swelling.  Again no periorbital cellulitis.  Pulmonary:     Effort: No respiratory distress.  Musculoskeletal:     Cervical back: Normal range of motion and neck supple.  Skin:    General: Skin is warm and dry.  Neurological:     Mental Status: She is alert.     ED Results / Procedures / Treatments   Labs (all labs  ordered are listed, but only abnormal results are displayed) Labs Reviewed - No data to display  EKG None  Radiology No results found.  Procedures Procedures    Medications Ordered in ED Medications  tobramycin (TOBREX) 0.3 % ophthalmic ointment (has no administration in time range)    ED Course/ Medical Decision Making/ A&P    Patient seen and examined. History obtained directly from patient. Work-up including labs, imaging, EKG ordered in triage, if performed, were reviewed.    Labs/EKG: None ordered  Imaging: None ordered  Medications/Fluids: Ordered: Tobramycin ointment.  Encouraged 1/2 inch ribbon 3 times a day  Most recent vital signs reviewed and are as follows: BP 122/85   Pulse 70   Temp 97.8 F (36.6 C)   Resp 18   Ht 5\' 8"  (1.727 m)   Wt 70.3 kg   LMP 12/13/2023   SpO2 96%   BMI 23.57 kg/m   Initial impression: Hordeolum on left, mild eyelid irritation on the right.  No signs of periorbital cellulitis.  Globe appears normal bilaterally.  Home treatment plan: Warm compresses, tobramycin ointment  Return instructions discussed with patient: New or worsening pain, swelling, fever, swelling around the eye.  Discussed that she may  need oral antibiotics if this occurs.  Follow-up instructions discussed with patient: Ophthalmology in 2 to 3 days if not improving.  Referral given.                                 Medical Decision Making Risk Prescription drug management.   Patient with some eye inflammation, small hordeolum left upper eyelid.  Overall symptoms are mild.  No foreign bodies noted or history of foreign body. No surrounding erythema, swelling, vision changes/loss suspicious for orbital or periorbital cellulitis. No signs of iritis.  Symptoms not consistent with orbital cellulitis.  No signs of glaucoma. No symptoms of retinal detachment. No ophthalmologic emergency suspected. Outpatient referral given in case of no improvement.           Final Clinical Impression(s) / ED Diagnoses Final diagnoses:  Hordeolum internum of left upper eyelid    Rx / DC Orders ED Discharge Orders     None         Renne Crigler, PA-C 12/28/23 1641    Linwood Dibbles, MD 12/29/23 [phone]

## 2023-12-28 NOTE — ED Notes (Signed)

## 2023-12-28 NOTE — ED Notes (Signed)
 Pt started accutane 2x weeks ago and applied it topically to her face. Noticed pus coming from stye on L eyelid, and having drainage from R side. Was squeezing both eyelids and had pus come out of each lash root pore. Eyelids are swollen and pt feels pressure.

## 2023-12-28 NOTE — ED Triage Notes (Signed)
 Bilateral eye styes to upper eye lids that started Monday  States draining pus from both   Wears contacts

## 2024-01-03 DIAGNOSIS — M25539 Pain in unspecified wrist: Secondary | ICD-10-CM | POA: Diagnosis not present

## 2024-01-03 DIAGNOSIS — M25531 Pain in right wrist: Secondary | ICD-10-CM | POA: Diagnosis not present

## 2024-01-10 DIAGNOSIS — M25531 Pain in right wrist: Secondary | ICD-10-CM | POA: Diagnosis not present

## 2024-01-12 DIAGNOSIS — Z5181 Encounter for therapeutic drug level monitoring: Secondary | ICD-10-CM | POA: Diagnosis not present

## 2024-01-12 DIAGNOSIS — L700 Acne vulgaris: Secondary | ICD-10-CM | POA: Diagnosis not present

## 2024-01-17 DIAGNOSIS — M25531 Pain in right wrist: Secondary | ICD-10-CM | POA: Diagnosis not present

## 2024-01-24 DIAGNOSIS — S52551D Other extraarticular fracture of lower end of right radius, subsequent encounter for closed fracture with routine healing: Secondary | ICD-10-CM | POA: Diagnosis not present

## 2024-02-16 DIAGNOSIS — Z5181 Encounter for therapeutic drug level monitoring: Secondary | ICD-10-CM | POA: Diagnosis not present

## 2024-02-16 DIAGNOSIS — L700 Acne vulgaris: Secondary | ICD-10-CM | POA: Diagnosis not present

## 2024-03-26 DIAGNOSIS — Z792 Long term (current) use of antibiotics: Secondary | ICD-10-CM | POA: Diagnosis not present

## 2024-04-23 DIAGNOSIS — Z792 Long term (current) use of antibiotics: Secondary | ICD-10-CM | POA: Diagnosis not present
